# Patient Record
Sex: Female | Born: 2000 | Race: White | Hispanic: No | Marital: Single | Smoking: Never smoker
Health system: Southern US, Community
[De-identification: ages and names within clinical notes are randomized; demographics above are authoritative.]

## PROBLEM LIST (undated history)

## (undated) DIAGNOSIS — R319 Hematuria, unspecified: Secondary | ICD-10-CM

## (undated) DIAGNOSIS — F32A Depression, unspecified: Secondary | ICD-10-CM

## (undated) DIAGNOSIS — R112 Nausea with vomiting, unspecified: Secondary | ICD-10-CM

## (undated) DIAGNOSIS — M79641 Pain in right hand: Secondary | ICD-10-CM

## (undated) DIAGNOSIS — K219 Gastro-esophageal reflux disease without esophagitis: Secondary | ICD-10-CM

## (undated) DIAGNOSIS — R002 Palpitations: Secondary | ICD-10-CM

## (undated) DIAGNOSIS — E669 Obesity, unspecified: Secondary | ICD-10-CM

## (undated) DIAGNOSIS — R059 Cough, unspecified: Secondary | ICD-10-CM

## (undated) DIAGNOSIS — R109 Unspecified abdominal pain: Secondary | ICD-10-CM

## (undated) DIAGNOSIS — J039 Acute tonsillitis, unspecified: Secondary | ICD-10-CM

## (undated) DIAGNOSIS — A048 Other specified bacterial intestinal infections: Secondary | ICD-10-CM

## (undated) DIAGNOSIS — J069 Acute upper respiratory infection, unspecified: Secondary | ICD-10-CM

## (undated) DIAGNOSIS — U071 COVID-19: Secondary | ICD-10-CM

## (undated) HISTORY — DX: Acute tonsillitis, unspecified: J03.90

## (undated) HISTORY — DX: Gastro-esophageal reflux disease without esophagitis: K21.9

## (undated) HISTORY — DX: COVID-19: U07.1

## (undated) HISTORY — PX: TONSILLECTOMY: SUR1361

## (undated) HISTORY — DX: Cough, unspecified: R05.9

## (undated) HISTORY — DX: Unspecified abdominal pain: R10.9

## (undated) HISTORY — DX: Obesity, unspecified: E66.9

## (undated) HISTORY — DX: Palpitations: R00.2

## (undated) HISTORY — DX: Pain in right hand: M79.641

## (undated) HISTORY — DX: Hematuria, unspecified: R31.9

## (undated) HISTORY — DX: Acute upper respiratory infection, unspecified: J06.9

## (undated) HISTORY — DX: Nausea with vomiting, unspecified: R11.2

## (undated) HISTORY — DX: Depression, unspecified: F32.A

## (undated) HISTORY — DX: Other specified bacterial intestinal infections: A04.8

---
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
## 2021-11-27 NOTE — Progress Notes (Deleted)
Cardiology Office Note:    Date:  11/27/2021   ID:  Linda Cook, DOB [birth year], MRN [id number]  PCP:  Ladora Daniel, PA-C   [facility] Providers Cardiologist:  None {  Referring MD: Julien Girt, PA*   History of Present Illness:    Linda Cook is a 21 y.o. female with no significant past medical history who was referred by Julien Girt, PA for further evaluation of palpitations.   Today, ***  No past medical history on file.  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     Allergies:   Patient has no known allergies.   Social History   Socioeconomic History   Marital status: Single    Spouse name: Not on file   Number of children: Not on file   Years of education: Not on file   Highest education level: Not on file  Occupational History   Not on file  Tobacco Use   Smoking status: Never   Smokeless tobacco: Not on file  Substance and Sexual Activity   Alcohol use: No   Drug use: No   Sexual activity: Not Currently  Other Topics Concern   Not on file  Social History Narrative   Not on file   Social Determinants of Health   Financial Resource Strain: Not on file  Food Insecurity: Not on file  Transportation Needs: Not on file  Physical Activity: Not on file  Stress: Not on file  Social Connections: Not on file     Family History: The patient's ***family history includes Hypertension in her maternal aunt, maternal uncle, paternal grandfather, and paternal grandmother.  ROS:   Please see the history of present illness.    *** All other systems reviewed and are negative.  EKGs/Labs/Other Studies Reviewed:    The following studies were reviewed today: ***  EKG:  EKG is *** ordered  today.  The ekg ordered today demonstrates ***  Recent Labs: No results found for requested labs within last 365 days.  Recent Lipid Panel No results found for: "CHOL", "TRIG", "HDL", "CHOLHDL", "VLDL", "LDLCALC", "LDLDIRECT"   Risk Assessment/Calculations:   {Does this patient have ATRIAL FIBRILLATION?:[phone]}  No BP recorded.  {Refresh Note OR Click here to enter BP  :1}***         Physical Exam:    VS:  There were no vitals taken for this visit.    Wt Readings from Last 3 Encounters:  09/11/17 190 lb (86.2 kg) (97 %, Z= 1.89)*  08/24/14 184 lb (83.5 kg) (98 %, Z= 2.13)*  02/05/11 114 lb (51.7 kg) (96 %, Z= 1.76)*   * Growth percentiles are based on CDC (Girls, 2-20 Years) data.     GEN: *** Well nourished, well developed in no acute distress HEENT: Normal NECK: No JVD; No carotid bruits LYMPHATICS: No lymphadenopathy CARDIAC: ***RRR, no murmurs, rubs, gallops RESPIRATORY:  Clear to auscultation without rales, wheezing or rhonchi  ABDOMEN: Soft, non-tender, non-distended MUSCULOSKELETAL:  No edema; No deformity  SKIN: Warm and dry NEUROLOGIC:  Alert and oriented x 3 PSYCHIATRIC:  Normal affect   ASSESSMENT:    No diagnosis found. PLAN:    In order of problems listed above:  #Palpitations: -Check zio monitor      {Are you ordering  a CV Procedure (e.g. stress test, cath, DCCV, TEE, etc)?   Press F2        :[id number]}    Medication Adjustments/Labs and Tests Ordered: Current medicines are reviewed at length with the patient today.  Concerns regarding medicines are outlined above.  No orders of the defined types were placed in this encounter.  No orders of the defined types were placed in this encounter.   There are no Patient Instructions on file for this visit.   Signed, Meriam Sprague, MD  11/27/2021 8:18 PM    [facility]

## 2021-12-10 ENCOUNTER — Ambulatory Visit: Payer: Self-pay | Admitting: Cardiology

## 2022-01-02 NOTE — Progress Notes (Deleted)
Cardiology Office Note:    Date:  01/02/2022   ID:  [name], DOB 2000-08-02, MRN [id number]  PCP:  Ladora Daniel, PA-C   [facility] Providers Cardiologist:  None {  Referring MD: Julien Girt, PA*   History of Present Illness:    Linda Cook is a 21 y.o. female with no significant past medical history who was referred by Julien Girt, PA for further evaluation of palpitations.   Today, ***  Past Medical History:  Diagnosis Date   Abdominal pain    Cough    COVID-19    Depression    H. 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     Allergies:   Patient has no known allergies.   Social History   Socioeconomic History   Marital status: Single    Spouse name: Not on file   Number of children: Not on file   Years of education: Not on file   Highest education level: Not on file  Occupational History   Not on file  Tobacco Use   Smoking status: Never   Smokeless tobacco: Not on file  Substance and Sexual Activity   Alcohol use: No   Drug use: No   Sexual activity: Not Currently  Other Topics Concern   Not on file  Social History Narrative   Not on file   Social Determinants of Health   Financial Resource Strain: Not on file  Food Insecurity: Not on file  Transportation Needs: Not on file  Physical Activity: Not on file  Stress: Not on file  Social Connections: Not on file     Family History: The patient's ***family history includes Hypertension in her maternal aunt, maternal uncle, paternal  grandfather, and paternal grandmother.  ROS:   Please see the history of present illness.    *** All other systems reviewed and are negative.  EKGs/Labs/Other Studies Reviewed:    The following studies were reviewed today: ***  EKG:  EKG is *** ordered today.  The ekg ordered today demonstrates ***  Recent Labs: No results found for requested labs within last 365 days.  Recent Lipid Panel No results found for: "CHOL", "TRIG", "HDL", "CHOLHDL", "VLDL", "LDLCALC", "LDLDIRECT"   Risk Assessment/Calculations:   {Does this patient have ATRIAL FIBRILLATION?:[id number]}  No BP recorded.  {Refresh Note OR Click here to enter BP  :1}***         Physical Exam:    VS:  There were no vitals taken for this visit.    Wt Readings from Last 3 Encounters:  09/11/17 190 lb (86.2 kg) (97 %, Z= 1.89)*  08/24/14 184 lb (83.5 kg) (98 %, Z= 2.13)*  02/05/11 114 lb (51.7 kg) (96 %, Z= 1.76)*   * Growth percentiles are based on CDC (Girls, 2-20 Years) data.     GEN: *** Well nourished, well developed in no acute distress HEENT: Normal NECK: No JVD; No carotid bruits LYMPHATICS: No lymphadenopathy CARDIAC: ***RRR, no  murmurs, rubs, gallops RESPIRATORY:  Clear to auscultation without rales, wheezing or rhonchi  ABDOMEN: Soft, non-tender, non-distended MUSCULOSKELETAL:  No edema; No deformity  SKIN: Warm and dry NEUROLOGIC:  Alert and oriented x 3 PSYCHIATRIC:  Normal affect   ASSESSMENT:    No diagnosis found. PLAN:    In order of problems listed above:  #Palpitations: -Check zio monitor      {Are you ordering a CV Procedure (e.g. stress test, cath, DCCV, TEE, etc)?   Press F2        :[id number]}    Medication Adjustments/Labs and Tests Ordered: Current medicines are reviewed at length with the patient today.  Concerns regarding medicines are outlined above.  No orders of the defined types were placed in this encounter.  No orders of the defined types were placed in this  encounter.   There are no Patient Instructions on file for this visit.   Signed, Freada Bergeron, MD  01/02/2022 8:43 AM    [place]

## 2022-01-05 ENCOUNTER — Ambulatory Visit: Payer: Self-pay | Admitting: Cardiology

## 2022-01-09 NOTE — Progress Notes (Unsigned)
Cardiology Office Note:    Date:  01/09/2022   ID:  Linda Cook, DOB 02-13-2001, MRN [id number]  PCP:  Ladora Daniel, PA-C   [facility] Providers Cardiologist:  None {  Referring MD: Julien Girt, PA*   History of Present Illness:    Linda Cook is a 21 y.o. female with no significant past medical history who was referred by Julien Girt, PA for further evaluation of palpitations.   Today, ***  Past Medical History:  Diagnosis Date   Abdominal pain    Cough    COVID-19    Depression    GERD (gastroesophageal reflux disease)    H. 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     Allergies:   Patient has no known allergies.   Social History   Socioeconomic History   Marital status: Single    Spouse name: Not on file   Number of children: Not on file   Years of education: Not on file   Highest education level: Not on file  Occupational History   Not on file  Tobacco Use   Smoking status: Never   Smokeless tobacco: Not on file  Substance and Sexual Activity   Alcohol use: No   Drug use: No   Sexual activity: Not Currently  Other Topics Concern   Not on file  Social History Narrative   Not on file   Social Determinants of Health   Financial Resource Strain: Not on file  Food Insecurity: Not on file  Transportation Needs: Not on file  Physical Activity: Not on file  Stress: Not on file  Social Connections: Not on file     Family History: The patient's ***family history includes Hypertension in her maternal aunt, maternal uncle,  paternal grandfather, and paternal grandmother.  ROS:   Please see the history of present illness.    *** All other systems reviewed and are negative.  EKGs/Labs/Other Studies Reviewed:    The following studies were reviewed today: ***  EKG:  EKG is *** ordered today.  The ekg ordered today demonstrates ***  Recent Labs: No results found for requested labs within last 365 days.  Recent Lipid Panel No results found for: "CHOL", "TRIG", "HDL", "CHOLHDL", "VLDL", "LDLCALC", "LDLDIRECT"   Risk Assessment/Calculations:   {Does this patient have ATRIAL FIBRILLATION?:[phone]}  No BP recorded.  {Refresh Note OR Click here to enter BP  :1}***         Physical Exam:    VS:  There were no vitals taken for this visit.    Wt Readings from Last 3 Encounters:  09/11/17 190 lb (86.2 kg) (97 %, Z= 1.89)*  08/24/14 184 lb (83.5 kg) (98 %, Z= 2.13)*  02/05/11 114 lb (51.7 kg) (96 %, Z= 1.76)*   * Growth percentiles are based on CDC (Girls, 2-20 Years) data.     GEN: *** Well nourished, well developed in no acute distress HEENT: Normal NECK: No JVD; No carotid bruits LYMPHATICS: No lymphadenopathy CARDIAC: ***RRR, no murmurs, rubs, gallops RESPIRATORY:  Clear to auscultation without rales, wheezing or rhonchi  ABDOMEN: Soft, non-tender, non-distended MUSCULOSKELETAL:  No edema; No deformity  SKIN: Warm and dry NEUROLOGIC:  Alert and oriented x 3 PSYCHIATRIC:  Normal affect   ASSESSMENT:    No diagnosis found. PLAN:    In order of problems listed above:  #Palpitations: -Check zio monitor      {Are you ordering a CV Procedure (e.g. stress test, cath, DCCV, TEE, etc)?   Press F2        :[id number]}    Medication Adjustments/Labs and Tests Ordered: Current medicines are reviewed at length with the patient today.  Concerns regarding medicines are outlined above.  No orders of the defined types were placed in this encounter.  No orders of the defined types were placed in  this encounter.   There are no Patient Instructions on file for this visit.   Signed, Freada Bergeron, MD  01/09/2022 8:08 PM    [place]

## 2022-01-12 ENCOUNTER — Ambulatory Visit: Payer: 59 | Attending: Cardiology | Admitting: Cardiology

## 2022-01-12 ENCOUNTER — Telehealth: Payer: Self-pay | Admitting: *Deleted

## 2022-01-12 ENCOUNTER — Ambulatory Visit (INDEPENDENT_AMBULATORY_CARE_PROVIDER_SITE_OTHER): Payer: 59

## 2022-01-12 ENCOUNTER — Encounter: Payer: Self-pay | Admitting: Cardiology

## 2022-01-12 VITALS — BP 118/68 | HR 77 | Ht 65.75 in | Wt 229.8 lb

## 2022-01-12 DIAGNOSIS — R002 Palpitations: Secondary | ICD-10-CM | POA: Diagnosis not present

## 2022-01-12 DIAGNOSIS — K219 Gastro-esophageal reflux disease without esophagitis: Secondary | ICD-10-CM

## 2022-01-12 NOTE — Telephone Encounter (Signed)
-----   Message from Jennefer Bravo sent at 01/12/2022  5:07 PM EDT ----- Regarding: RE: 7 DAY ZIO PER DR. Johney Frame done ----- Message ----- From: Nuala Alpha, LPN Sent: 90/93/1121   4:07 PM EDT To: Jennefer Bravo; Katrina Cloyd Stagers Subject: 7 DAY ZIO PER DR. Johney Frame                    Dr. Johney Frame ordered a 7 day zio on this pt for palpitations  Please enroll and let me know when you do.   Thanks [employer]

## 2022-01-12 NOTE — Progress Notes (Signed)
Cardiology Office Note:    Date:  01/12/2022   ID:  Linda Cook, DOB June 01, 2000, MRN [id number]  PCP:  Nicholes Rough, [place] Providers Cardiologist:  None {  Referring MD: Matthew Saras, PA*   History of Present Illness:    Linda Cook is a 21 y.o. female with no significant past medical history who was referred by Matthew Saras, PA for further evaluation of palpitations.   Today, the patient states that she has been having frequent palpitations. They typically last for a minute or two with a sudden onset. The episodes occur randomly such as while painting in her studio, walking up stairs, or lying down at night. Has some associated chest discomfort with them but no lightheadedness, dizziness or syncope. Of note, she becomes dehydrated easily. Usually she keeps a water bottle with her.  Occasionally she has chest discomfort secondary to her palpitations. However, she attributes most of her chest pain to GERD. She endorses "a lot" of acid reflux with random onset. She keeps Tums with her for management of this. During a 5-hour shift at work she may require up to 7 Tums. She has been following with GI.  She denies any shortness of breath, or peripheral edema. No syncope, orthopnea, or PND.   Past Medical History:  Diagnosis Date   Abdominal pain    Cough    COVID-19    Depression    GERD (gastroesophageal reflux disease)    H. 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 For bronchial spasms       Allergies:    Topiramate and Phentermine   Social History   Socioeconomic History   Marital status: Single    Spouse name: Not on file   Number of children: Not on file   Years of education: Not on file   Highest education level: Not on file  Occupational History   Not on file  Tobacco Use   Smoking status: Never   Smokeless tobacco: Not on file  Substance and Sexual Activity   Alcohol use: No   Drug use: No   Sexual activity: Not Currently  Other Topics Concern   Not on file  Social History Narrative   Not on file   Social Determinants of Health   Financial Resource Strain: Not on file  Food Insecurity: Not on file  Transportation Needs: Not on file  Physical Activity: Not on file  Stress: Not on file  Social Connections: Not on file     Family History: The patient's family history includes Hypertension in her maternal aunt, maternal uncle, paternal grandfather, and paternal grandmother.  ROS:   Review of Systems  Constitutional:  Negative for chills and fever.  HENT:  Negative for nosebleeds and tinnitus.   Eyes:  Negative for blurred vision and pain.  Respiratory:  Negative for cough, hemoptysis, shortness of breath and stridor.   Cardiovascular:  Positive for chest pain and palpitations. Negative for orthopnea, claudication,  leg swelling and PND.  Gastrointestinal:  Positive for heartburn. Negative for blood in stool, diarrhea, nausea and vomiting.  Genitourinary:  Negative for dysuria and hematuria.  Musculoskeletal:  Negative for falls.  Neurological:  Positive for dizziness and headaches. Negative for loss of consciousness.  Psychiatric/Behavioral:  Negative for depression, hallucinations and substance abuse. The patient does not have insomnia.      EKGs/Labs/Other Studies Reviewed:    The following studies were reviewed today: No prior cardiovascular studies available.  EKG:  EKG is personally reviewed. 01/12/2022:  Sinus rhythm. Rate 77 bpm.  Recent Labs: No  results found for requested labs within last 365 days.   Recent Lipid Panel No results found for: "CHOL", "TRIG", "HDL", "CHOLHDL", "VLDL", "LDLCALC", "LDLDIRECT"   Risk Assessment/Calculations:                Physical Exam:    VS:  BP 118/68   Pulse 77   Ht 5' 5.75" (1.67 m)   Wt 229 lb 12.8 oz (104.2 kg)   SpO2 98%   BMI 37.37 kg/m     Wt Readings from Last 3 Encounters:  01/12/22 229 lb 12.8 oz (104.2 kg)  09/11/17 190 lb (86.2 kg) (97 %, Z= 1.89)*  08/24/14 184 lb (83.5 kg) (98 %, Z= 2.13)*   * Growth percentiles are based on CDC (Girls, 2-20 Years) data.     GEN: Well nourished, well developed in no acute distress HEENT: Normal NECK: No JVD; No carotid bruits CARDIAC: RRR, 1/6 early systolic flow murmur, No rubs, no gallops RESPIRATORY:  Clear to auscultation without rales, wheezing or rhonchi  ABDOMEN: Soft, non-tender, non-distended MUSCULOSKELETAL:  No edema; No deformity  SKIN: Warm and dry NEUROLOGIC:  Alert and oriented x 3 PSYCHIATRIC:  Normal affect   ASSESSMENT:    1. Palpitations   2. Gastroesophageal reflux disease, unspecified whether esophagitis present    PLAN:    In order of problems listed above:  #Palpitations: Occurring intermittently often daily or every other day. No clear triggers. Has some mild associated chest discomfort. Will check zio monitor for further evaluation.  -Check zio monitor -Increase hydration -Cut back on caffeine -No significant stimulant or supplement use  #GERD: -Management per GI         Follow-up:  PRN.  Medication Adjustments/Labs and Tests Ordered: Current medicines are reviewed at length with the patient today.  Concerns regarding medicines are outlined above.   Orders Placed This Encounter  Procedures   LONG TERM MONITOR (3-14 DAYS)   EKG 12-Lead   No orders of the defined types were placed in this encounter.  Patient Instructions  Medication Instructions:   Your physician recommends  that you continue on your current medications as directed. Please refer to the Current Medication list given to you today.  *If you need a refill on your cardiac medications before your next appointment, please call your pharmacy*   Testing/Procedures:  ZIO XT- Long Term Monitor Instructions  Your physician has requested you wear a ZIO patch monitor for 7 days.  This is a single patch monitor. [employer] supplies one patch monitor per enrollment. Additional stickers are not available. Please do not apply patch if you will be having a Nuclear Stress Test,  Echocardiogram, Cardiac CT, MRI, or Chest Xray during the period you would be wearing the  monitor. The patch cannot be worn during these tests. You cannot remove and re-apply the  ZIO XT patch monitor.  Your ZIO patch monitor will be  mailed 3 day USPS to your address on file. It may take 3-5 days  to receive your monitor after you have been enrolled.  Once you have received your monitor, please review the enclosed instructions. Your monitor  has already been registered assigning a specific monitor serial # to you.  Billing and Patient Assistance Program Information  We have supplied [employer] with any of your insurance information on file for billing purposes. [employer] offers a sliding scale Patient Assistance Program for patients that do not have  insurance, or whose insurance does not completely cover the cost of the ZIO monitor.  You must apply for the Patient Assistance Program to qualify for this discounted rate.  To apply, please call [employer] at [phone], select option 4, select option 2, ask to apply for  Patient Assistance Program. Meredeth Ide will ask your household income, and how many people  are in your household. They will quote your out-of-pocket cost based on that information.  [employer] will also be able to set up a 31-month, interest-free payment plan if needed.  Applying the monitor   Shave hair from upper left chest.   Hold abrader disc by orange tab. Rub abrader in 40 strokes over the upper left chest as  indicated in your monitor instructions.  Clean area with 4 enclosed alcohol pads. Let dry.  Apply patch as indicated in monitor instructions. Patch will be placed under collarbone on left  side of chest with arrow pointing upward.  Rub patch adhesive wings for 2 minutes. Remove white label marked "1". Remove the white  label marked "2". Rub patch adhesive wings for 2 additional minutes.  While looking in a mirror, press and release button in center of patch. A small green light will  flash 3-4 times. This will be your only indicator that the monitor has been turned on.  Do not shower for the first 24 hours. You may shower after the first 24 hours.  Press the button if you feel a symptom. You will hear a small click. Record Date, Time and  Symptom in the Patient Logbook.  When you are ready to remove the patch, follow instructions on the last 2 pages of Patient  Logbook. Stick patch monitor onto the last page of Patient Logbook.  Place Patient Logbook in the blue and white box. Use locking tab on box and tape box closed  securely. The blue and white box has prepaid postage on it. Please place it in the mailbox as  soon as possible. Your physician should have your test results approximately 7 days after the  monitor has been mailed back to [facility].  Call [facility] Customer Care at [phone] if you have questions regarding  your ZIO XT patch monitor. Call them immediately if you see an orange light blinking on your  monitor.  If your monitor falls off in less than 4 days, contact our Monitor department at [phone].  If your monitor becomes loose or falls off after 4 days call [employer] at [phone] for  suggestions on securing your monitor    Follow-Up:  AS NEEDED WITH DR. Shari Prows   Important Information About Sugar          I,Mathew Stumpf,acting as a scribe for  Meriam Sprague, MD.,have documented all relevant documentation on the behalf of Meriam Sprague, MD,as directed by  Meriam Sprague, MD while in the presence of Meriam Sprague, MD.  I, Meriam Sprague, MD, have reviewed all documentation for this visit.  The documentation on 01/12/22 for the exam, diagnosis, procedures, and orders are all accurate and complete.   Signed, Meriam Sprague, MD  01/12/2022 4:13 PM    [facility]

## 2022-01-12 NOTE — Patient Instructions (Signed)
Medication Instructions:   Your physician recommends that you continue on your current medications as directed. Please refer to the Current Medication list given to you today.  *If you need a refill on your cardiac medications before your next appointment, please call your pharmacy*   Testing/Procedures:  [facility] Monitor Instructions  Your physician has requested you wear a ZIO patch monitor for 7 days.  This is a single patch monitor. [employer] supplies one patch monitor per enrollment. Additional stickers are not available. Please do not apply patch if you will be having a Nuclear Stress Test,  Echocardiogram, Cardiac CT, MRI, or Chest Xray during the period you would be wearing the  monitor. The patch cannot be worn during these tests. You cannot remove and re-apply the  ZIO XT patch monitor.  Your ZIO patch monitor will be mailed 3 day USPS to your address on file. It may take 3-5 days  to receive your monitor after you have been enrolled.  Once you have received your monitor, please review the enclosed instructions. Your monitor  has already been registered assigning a specific monitor serial # to you.  Billing and Patient Assistance Program Information  We have supplied [employer] with any of your insurance information on file for billing purposes. [employer] offers a sliding scale Patient Assistance Program for patients that do not have  insurance, or whose insurance does not completely cover the cost of the ZIO monitor.  You must apply for the Patient Assistance Program to qualify for this discounted rate.  To apply, please call [employer] at [phone], select option 4, select option 2, ask to apply for  Patient Assistance Program. Theodore Demark will ask your household income, and how many people  are in your household. They will quote your out-of-pocket cost based on that information.  [employer] will also be able to set up a 43-month interest-free payment plan if  needed.  Applying the monitor   Shave hair from upper left chest.  Hold abrader disc by orange tab. Rub abrader in 40 strokes over the upper left chest as  indicated in your monitor instructions.  Clean area with 4 enclosed alcohol pads. Let dry.  Apply patch as indicated in monitor instructions. Patch will be placed under collarbone on left  side of chest with arrow pointing upward.  Rub patch adhesive wings for 2 minutes. Remove white label marked "1". Remove the white  label marked "2". Rub patch adhesive wings for 2 additional minutes.  While looking in a mirror, press and release button in center of patch. A small green light will  flash 3-4 times. This will be your only indicator that the monitor has been turned on.  Do not shower for the first 24 hours. You may shower after the first 24 hours.  Press the button if you feel a symptom. You will hear a small click. Record Date, Time and  Symptom in the Patient Logbook.  When you are ready to remove the patch, follow instructions on the last 2 pages of Patient  Logbook. Stick patch monitor onto the last page of Patient Logbook.  Place Patient Logbook in the blue and white box. Use locking tab on box and tape box closed  securely. The blue and white box has prepaid postage on it. Please place it in the mailbox as  soon as possible. Your physician should have your test results approximately 7 days after [facility]  [facility]  Call [employer]  Customer Care at [phone] if you have questions regarding  your ZIO XT patch monitor. Call them immediately if you see an orange light blinking on your  monitor.  If your monitor falls off in less than 4 days, contact our Monitor department at [phone].  If your monitor becomes loose or falls off after 4 days call [employer] at [phone] for  suggestions on securing your monitor    Follow-Up:  AS NEEDED WITH DR. Johney Frame   Important Information  About [employer]

## 2022-01-12 NOTE — Progress Notes (Unsigned)
Enrolled for [employer] to mail a ZIO XT long term holter monitor to the patients address on file.  

## 2022-01-16 DIAGNOSIS — R002 Palpitations: Secondary | ICD-10-CM

## 2022-01-19 ENCOUNTER — Telehealth: Payer: Self-pay | Admitting: Cardiology

## 2022-01-19 NOTE — Telephone Encounter (Signed)
3 attempts to return call.  Call goes to VM of [facility]. No DPR. No message left.

## 2022-01-19 NOTE — Telephone Encounter (Signed)
Pt states her skin is blistering from  heart monitor and she wants to know if its okay if she takes it off

## 2022-02-09 ENCOUNTER — Telehealth: Payer: Self-pay | Admitting: *Deleted

## 2022-02-09 MED ORDER — METOPROLOL TARTRATE 25 MG PO TABS: 12.5000 mg | ORAL_TABLET | Freq: Two times a day (BID) | ORAL | 2 refills | Status: AC

## 2022-02-09 NOTE — Telephone Encounter (Signed)
-----   Message from Meriam Sprague, MD sent at 02/09/2022  2:56 PM EST ----- Her heart monitor showed normal rhythm but she would go fast up to about 200bpm which would correlate with her symptoms. Unless she was exercising at this time, this is likely the reason she is having palpitations. To try to make her symptoms better, we can trial low dose metoprolol and see how she does (12.5mg  tartrate BID).

## 2022-02-09 NOTE — Telephone Encounter (Signed)
The patient and her mother have been notified of the result and verbalized understanding.  All questions (if any) were answered.  Pt states she was not exercising or doing anything strenuous during the times her monitor recorded elevated heart rates.    Pt states she is still experiencing palpitations and racing heart.   Pt states she is working on healthy weight loss and has recently been prescribed phentermine by her PCP for weight loss.  Pt states she did not take this during the time she wore her heart monitor.    Pt is asking if she should hold off on taking phentermine at this time,  based on this result and elevated rates as high as 200 bpm.  Advised the pt that she should refrain from taking this medication, until she runs this result by her PCP.  Pt states she has an appt with her PCP tomorrow and will discuss this with her at that visit.  She will discuss other alternative weight loss medications, like ozempic/wegovy/mounjaro.  Pt aware that these new weight loss injections are what we advise as safe to use for the majority of our cardiac patients, and discussing this with her PCP tomorrow, would be a great plan.   Pt states she would like to proceed with taking metoprolol tartrate 12.5 mg po bid to help with her palpitations for now and until she can get her weight loss journey started.   Confirmed the pharmacy of choice with the pt.   Pt aware I will route this result to her PCP for reference when seeing the pt tomorrow.   Pt verbalized understanding and agrees with this plan.
